# Patient Record
Sex: Female | Born: 1935 | Race: White | Hispanic: No | Smoking: Current every day smoker
Health system: Southern US, Community
[De-identification: ages and names within clinical notes are randomized; demographics above are authoritative.]

## PROBLEM LIST (undated history)

## (undated) DIAGNOSIS — J449 Chronic obstructive pulmonary disease, unspecified: Secondary | ICD-10-CM

---

## 2007-11-20 ENCOUNTER — Ambulatory Visit: Payer: Self-pay | Admitting: Internal Medicine

## 2008-12-27 ENCOUNTER — Ambulatory Visit: Payer: Self-pay | Admitting: Internal Medicine

## 2013-05-02 ENCOUNTER — Ambulatory Visit: Payer: Self-pay | Admitting: Orthopedic Surgery

## 2013-05-27 ENCOUNTER — Encounter: Payer: Self-pay | Admitting: Nurse Practitioner

## 2013-05-31 ENCOUNTER — Encounter: Payer: Self-pay | Admitting: Nurse Practitioner

## 2014-11-18 DIAGNOSIS — M5416 Radiculopathy, lumbar region: Secondary | ICD-10-CM | POA: Insufficient documentation

## 2014-11-18 DIAGNOSIS — M5126 Other intervertebral disc displacement, lumbar region: Secondary | ICD-10-CM | POA: Insufficient documentation

## 2015-01-12 ENCOUNTER — Encounter: Payer: Self-pay | Admitting: Family Medicine

## 2015-01-31 ENCOUNTER — Encounter: Payer: Self-pay | Admitting: Family Medicine

## 2015-05-18 ENCOUNTER — Other Ambulatory Visit: Payer: Self-pay | Admitting: Family Medicine

## 2015-05-18 ENCOUNTER — Ambulatory Visit
Admission: RE | Admit: 2015-05-18 | Discharge: 2015-05-18 | Disposition: A | Discharge status: Home / Self Care | Payer: Medicare Other | Source: Ambulatory Visit | Attending: Family Medicine | Admitting: Family Medicine

## 2015-05-18 DIAGNOSIS — J441 Chronic obstructive pulmonary disease with (acute) exacerbation: Secondary | ICD-10-CM | POA: Diagnosis present

## 2015-05-18 DIAGNOSIS — J439 Emphysema, unspecified: Secondary | ICD-10-CM | POA: Insufficient documentation

## 2015-05-18 DIAGNOSIS — J449 Chronic obstructive pulmonary disease, unspecified: Secondary | ICD-10-CM | POA: Insufficient documentation

## 2015-05-18 DIAGNOSIS — I7090 Unspecified atherosclerosis: Secondary | ICD-10-CM | POA: Insufficient documentation

## 2016-10-23 ENCOUNTER — Emergency Department: Payer: Medicare Other

## 2016-10-23 ENCOUNTER — Encounter: Payer: Self-pay | Admitting: Emergency Medicine

## 2016-10-23 ENCOUNTER — Emergency Department
Admission: EM | Admit: 2016-10-23 | Discharge: 2016-10-23 | Disposition: A | Discharge status: Home / Self Care | Payer: Medicare Other | Attending: Emergency Medicine | Admitting: Emergency Medicine

## 2016-10-23 DIAGNOSIS — F17210 Nicotine dependence, cigarettes, uncomplicated: Secondary | ICD-10-CM | POA: Insufficient documentation

## 2016-10-23 DIAGNOSIS — J441 Chronic obstructive pulmonary disease with (acute) exacerbation: Secondary | ICD-10-CM | POA: Insufficient documentation

## 2016-10-23 DIAGNOSIS — R0789 Other chest pain: Secondary | ICD-10-CM

## 2016-10-23 HISTORY — DX: Chronic obstructive pulmonary disease, unspecified: J44.9

## 2016-10-23 LAB — URINALYSIS COMPLETE WITH MICROSCOPIC (ARMC ONLY)
BACTERIA UA: NONE SEEN
Bilirubin Urine: NEGATIVE
Glucose, UA: NEGATIVE mg/dL
Ketones, ur: NEGATIVE mg/dL
Leukocytes, UA: NEGATIVE
NITRITE: NEGATIVE
PH: 7 (ref 5.0–8.0)
PROTEIN: NEGATIVE mg/dL
SPECIFIC GRAVITY, URINE: 1.011 (ref 1.005–1.030)

## 2016-10-23 LAB — BASIC METABOLIC PANEL
Anion gap: 10 (ref 5–15)
BUN: 9 mg/dL (ref 6–20)
CHLORIDE: 100 mmol/L — AB (ref 101–111)
CO2: 25 mmol/L (ref 22–32)
Calcium: 9.2 mg/dL (ref 8.9–10.3)
Creatinine, Ser: 0.82 mg/dL (ref 0.44–1.00)
GFR calc Af Amer: >60 mL/min (ref >60)
GFR calc non Af Amer: >60 mL/min (ref >60)
GLUCOSE: 100 mg/dL — AB (ref 65–99)
POTASSIUM: 4.2 mmol/L (ref 3.5–5.1)
SODIUM: 135 mmol/L (ref 135–145)

## 2016-10-23 LAB — CBC
HEMATOCRIT: 47.7 % — AB (ref 35.0–47.0)
Hemoglobin: 16.6 g/dL — ABNORMAL HIGH (ref 12.0–16.0)
MCH: 31 pg (ref 26.0–34.0)
MCHC: 34.8 g/dL (ref 32.0–36.0)
MCV: 89.2 fL (ref 80.0–100.0)
Platelets: 196 10*3/uL (ref 150–440)
RBC: 5.35 MIL/uL — ABNORMAL HIGH (ref 3.80–5.20)
RDW: 14 % (ref 11.5–14.5)
WBC: 8.4 10*3/uL (ref 3.6–11.0)

## 2016-10-23 LAB — TROPONIN I: Troponin I: <0.03 ng/mL (ref <0.03)

## 2016-10-23 MED ORDER — PREDNISONE 10 MG PO TABS: ORAL_TABLET | ORAL | 0 refills | Status: DC

## 2016-10-23 MED ORDER — PREDNISONE 20 MG PO TABS
40.0000 mg | ORAL_TABLET | Freq: Once | ORAL | Status: AC
  Administered 2016-10-23: 40 mg via ORAL
  Filled 2016-10-23: qty 2

## 2016-10-23 NOTE — Discharge Instructions (Signed)
You were evaluated for left-sided chest discomfort, and although no certain cause was found, we discussed your exam and evaluation are reassuring in the emergency department today.  We discussed the possibility of inflammation based pain such as pleurisy or muscle spasm, ibuprofen over-the-counter 600 mg every 8 hours may help with this.  We discussed that COPD exacerbation may be contributing, and go ahead and continue the clarithromycin and I will add prednisone.  We discussed that your heart evaluation is reassuring in the emergency department, but I want you to see a cardiologist this week. You may call the on-call cardiologist for today, Dr. Windell HummingbirdGollan's office, or a cardiologist of your choice.  Return to the emergency department immediately for any worsening symptoms including trouble breathing, fever, vomiting, palpitations, dizziness or numbness, confusion altered mental status, passing out, or any worsening chest pain.

## 2016-10-23 NOTE — ED Triage Notes (Signed)
Patient presents to the ED with left sided chest pain x 2-3 days that is worse with movement and sore on palpation.  Patient states, "I'm sure it's my copd acting up."  Patient reports pain has been getting increasingly worse x 2 days.  Patient reports recently taking antibiotics prescribed by pcp for copd exacerbation.  Patient is in no obvious distress at this time.  Patient states pain is relieved by lying still.  Patient denies shortness of breath at this time.  Patient's respirations are even and not labored at this time.

## 2016-10-23 NOTE — ED Provider Notes (Signed)
[facility] Emergency Department Provider Note ____________________________________________   I have reviewed the triage vital signs and the triage nursing note.  HISTORY  Chief Complaint Chest Pain   Historian Patient  HPI Makayla Maddox is a 82 y.o. female with a history of COPD, presents today due to 3 days of left under the breast pain which is occasionally sharp and she also feels it in the left back as well. She states that she thought may be COPD was causing and so she started increasing the dose that she's been using her Ventolin inhaler to about 4 times per day. She is unsure if the pain just eases off on its own or if it's related to the increase Ventolin use. No fever. She has sputum production which is yellowish. She was started on clarithromycin a few days ago and then stopped and then restarted it yesterday. She has not been on prednisone.  Because of the chest discomfort which is intermittent and nonexertional, and somewhat is not really reproducible, she did see her primary doctor Dr. Quillian QuinceBliss this morning. She was told to come to the ED for further evaluation due to chest pain and she was told possible changes on EKG.  She has never had a cardiac evaluation, nor history of a heart attack.    Past Medical History:  Diagnosis Date  . COPD (chronic obstructive pulmonary disease) (HCC)     There are no active problems to display for this patient.   History reviewed. No pertinent surgical history.  Prior to Admission medications   Medication Sig Start Date End Date Taking? Authorizing Provider  predniSONE (DELTASONE) 10 MG tablet 40mg  daily for 4 more days 10/23/16   Governor Rooksebecca Jasara Corrigan, MD    Allergies  Allergen Reactions  . Penicillins Hives    No family history on file.  Social History Social History  Substance Use Topics  . Smoking status: Current Every Day Smoker    Packs/day: 0.50    Types: Cigarettes  . Smokeless tobacco: Never Used   . Alcohol use Yes     Comment: 1 glass of wine with dinner daily    Review of Systems  Constitutional: Negative for fever. Eyes: Negative for visual changes. ENT: Negative for sore throat. Cardiovascular: Negative for Palpitations. Respiratory: Mild shortness of breath which is to her basically consistent with her underlying baseline COPD. Gastrointestinal: Negative for abdominal pain, vomiting and diarrhea. Genitourinary: Negative for dysuria. Musculoskeletal: Negative for back pain. Skin: Negative for rash. Neurological: Negative for headache. 10 point Review of Systems otherwise negative ____________________________________________   PHYSICAL EXAM:  VITAL SIGNS: ED Triage Vitals  Enc Vitals Group     BP 10/23/16 0920 (!) 168/103     Pulse Rate 10/23/16 0920 81     Resp 10/23/16 0920 (!) 22     Temp 10/23/16 0920 97.7 F (36.5 C)     Temp Source 10/23/16 0920 Oral     SpO2 10/23/16 0920 97 %     Weight 10/23/16 0921 112 lb (50.8 kg)     Height 10/23/16 0921 5\' 2"  (1.575 m)     Head Circumference --      Peak Flow --      Pain Score 10/23/16 0924 5     Pain Loc --      Pain Edu? --      Excl. in GC? --      Constitutional: Alert and oriented. Well appearing and in no distress. HEENT   Head: Normocephalic  and atraumatic.      Eyes: Conjunctivae are normal. PERRL. Normal extraocular movements.      Ears:         Nose: No congestion/rhinnorhea.   Mouth/Throat: Mucous membranes are moist.   Neck: No stridor. Cardiovascular/Chest: Normal rate, regular rhythm.  No murmurs, rubs, or gallops. Respiratory: Normal respiratory effort without tachypnea nor retractions. Breath sounds are clear and equal bilaterally. No wheezes/rales/rhonchi. Gastrointestinal: Soft. No distention, no guarding, no rebound. Nontender.    Genitourinary/rectal:Deferred Musculoskeletal: Nontender with normal range of motion in all extremities. No joint effusions.  No lower extremity  tenderness.  No edema. Neurologic:  Normal speech and language. No gross or focal neurologic deficits are appreciated. Skin:  Skin is warm, dry and intact. No rash noted. Psychiatric: Mood and affect are normal. Speech and behavior are normal. Patient exhibits appropriate insight and judgment.   ____________________________________________  LABS (pertinent positives/negatives)  Labs Reviewed  BASIC METABOLIC PANEL - Abnormal; Notable for the following:       Result Value   Chloride 100 (*)    Glucose, Bld 100 (*)    All other components within normal limits  CBC - Abnormal; Notable for the following:    RBC 5.35 (*)    Hemoglobin 16.6 (*)    HCT 47.7 (*)    All other components within normal limits  URINALYSIS COMPLETEWITH MICROSCOPIC ([facility] ONLY) - Abnormal; Notable for the following:    Color, Urine YELLOW (*)    APPearance CLEAR (*)    Hgb urine dipstick 1+ (*)    Squamous Epithelial / LPF 0-5 (*)    All other components within normal limits  TROPONIN I    ____________________________________________    EKG I, Governor Rooks, MD, the attending physician have personally viewed and interpreted all ECGs.  77 bpm. Normal sinus rhythm. Incomplete right bundle branch block. Normal axis. Normal ST and T-wave ____________________________________________  RADIOLOGY All Xrays were viewed by me. Imaging interpreted by Radiologist.  Chest x-ray two-view: No active cardiopulmonary disease. Aortic atherosclerosis. __________________________________________  PROCEDURES  Procedure(s) performed: None  Critical Care performed: None  ____________________________________________   ED COURSE / ASSESSMENT AND PLAN  Pertinent labs & imaging results that were available during my care of the patient were reviewed by me and considered in my medical decision making (see chart for details).    [name] was sent here by her primary doctor due to complaint of chest pain. On my  exam she is describing nonexertional intermittent, atypical chest discomfort. She is having no pleuritic chest pain. She did describe some burping and belching after she ate some sausage. She also thinks that may be the Ventolin is helping.  The EKG sent from the office was read by the computer as possible inferior infarct, and when I look at this EKG that was sent with her there is some motion artifact inferiorly, there is at least one beat that shows normal ST segment. On the EKG here is reassuring. Symptoms have been fairly frequent for a few days now. Very atypical, and I think it is unlikely to be due to ACS. She has not had a cardiac evaluation, and I have recommended that she follow up with a cardiologist. She states that she wanted to see her own cardiologist but I will give her the follow-up office number for our on-call cardiologist.  Troponin negative and reassuring. Symptoms ongoing all night, I don't think that she needs a repeat today.  Chest x-ray shows no pneumonia or  collapsed lung. Symptoms do not sound consistent with PE.  No hypoxia, no fever, and no tachycardia.  I discussed the possibility of pleurisy and muscle spasm versus GI vs symptoms associated with copd exacerbation.  I'm going to add prednisone to help with COPD exacerbation, and potentially other inflammatory based painful conditions.   CONSULTATIONS:   None   Patient / Family / Caregiver informed of clinical course, medical decision-making process, and agree with plan.   I discussed return precautions, follow-up instructions, and discharge instructions with patient and/or family.   ___________________________________________   FINAL CLINICAL IMPRESSION(S) / ED DIAGNOSES   Final diagnoses:  Atypical chest pain  COPD exacerbation (HCC)              Note: This dictation was prepared with Dragon dictation. Any transcriptional errors that result from this process are unintentional    Governor Rooks, MD 10/23/16 1200

## 2017-12-14 IMAGING — CR DG CHEST 2V
2 series · 2 of 2 positions shown · non-contrast
Comparison: Radiographs May 18, 2015.

CLINICAL DATA: Chest pain.

EXAM:
CHEST  2 VIEW

[chest pa]
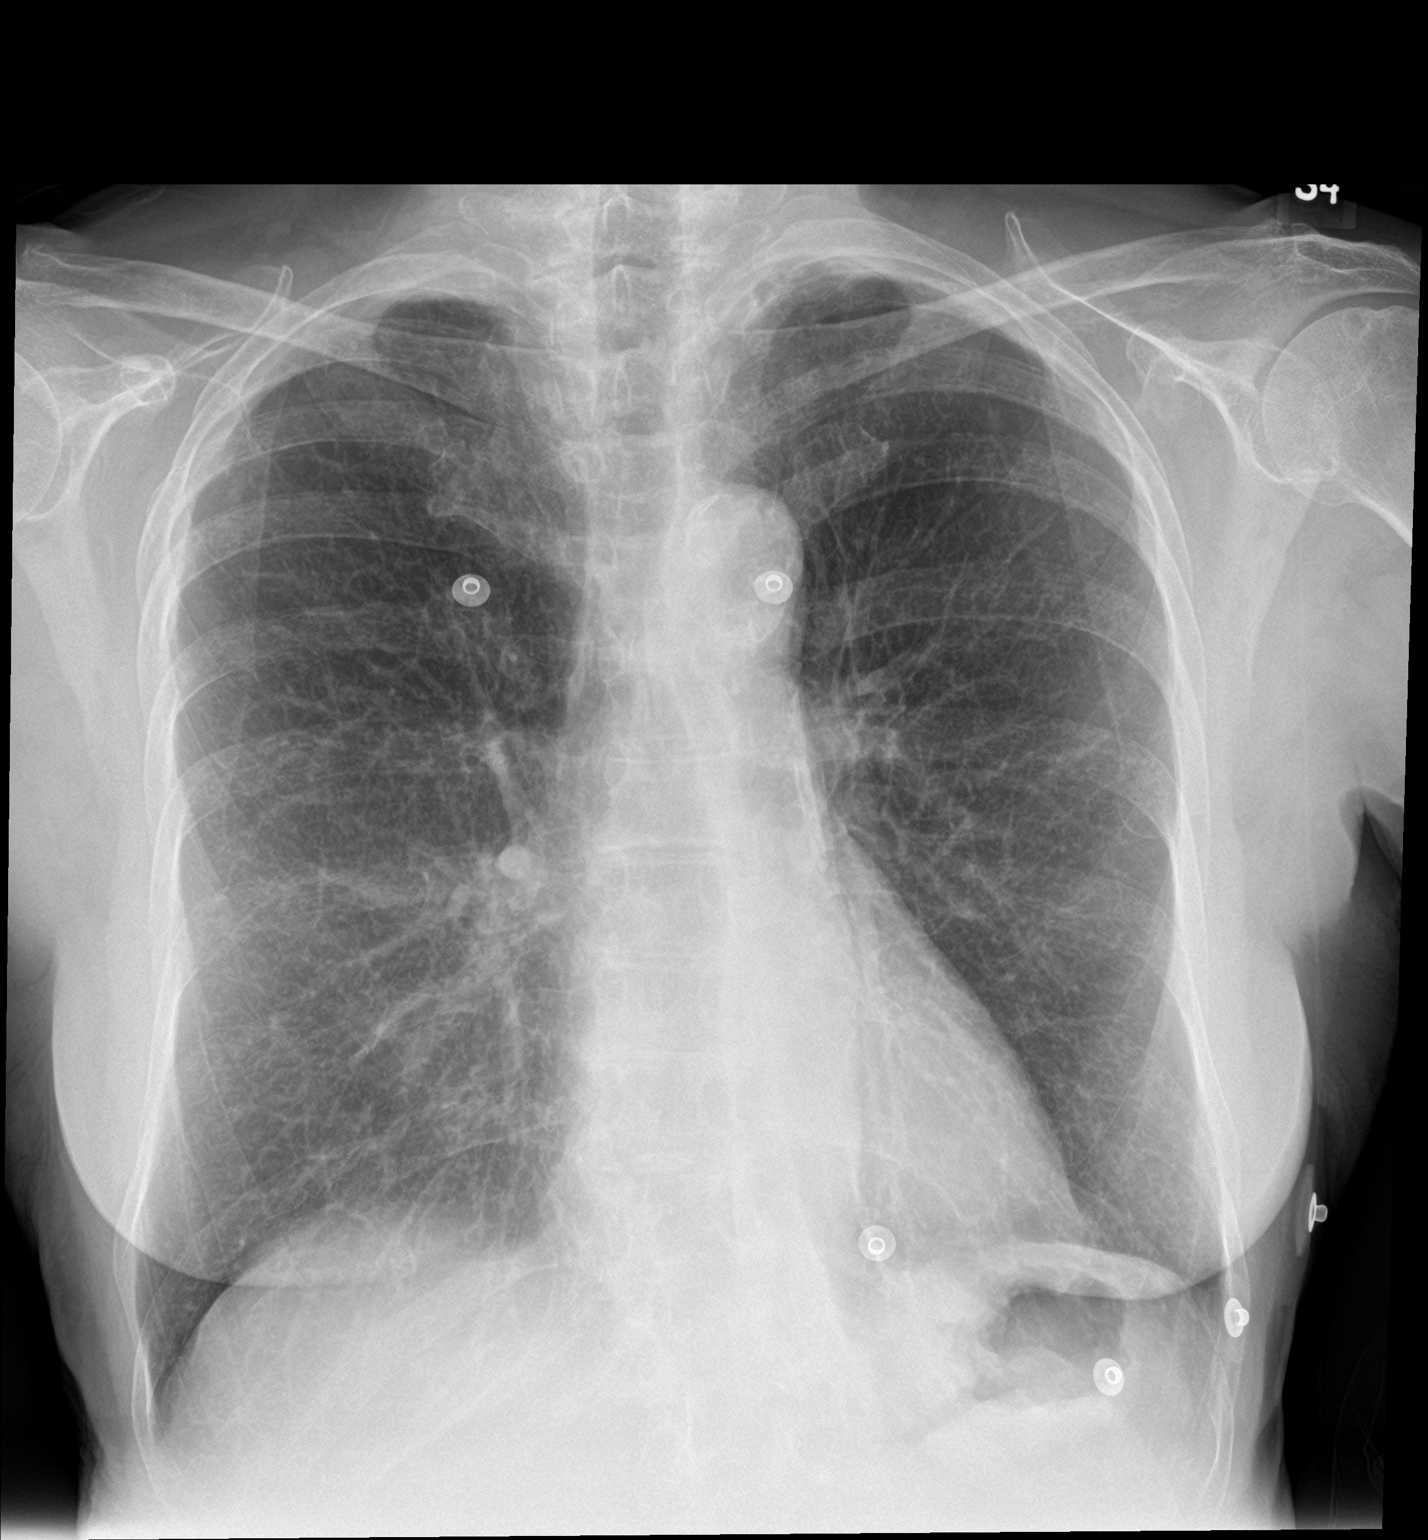

[chest lat]
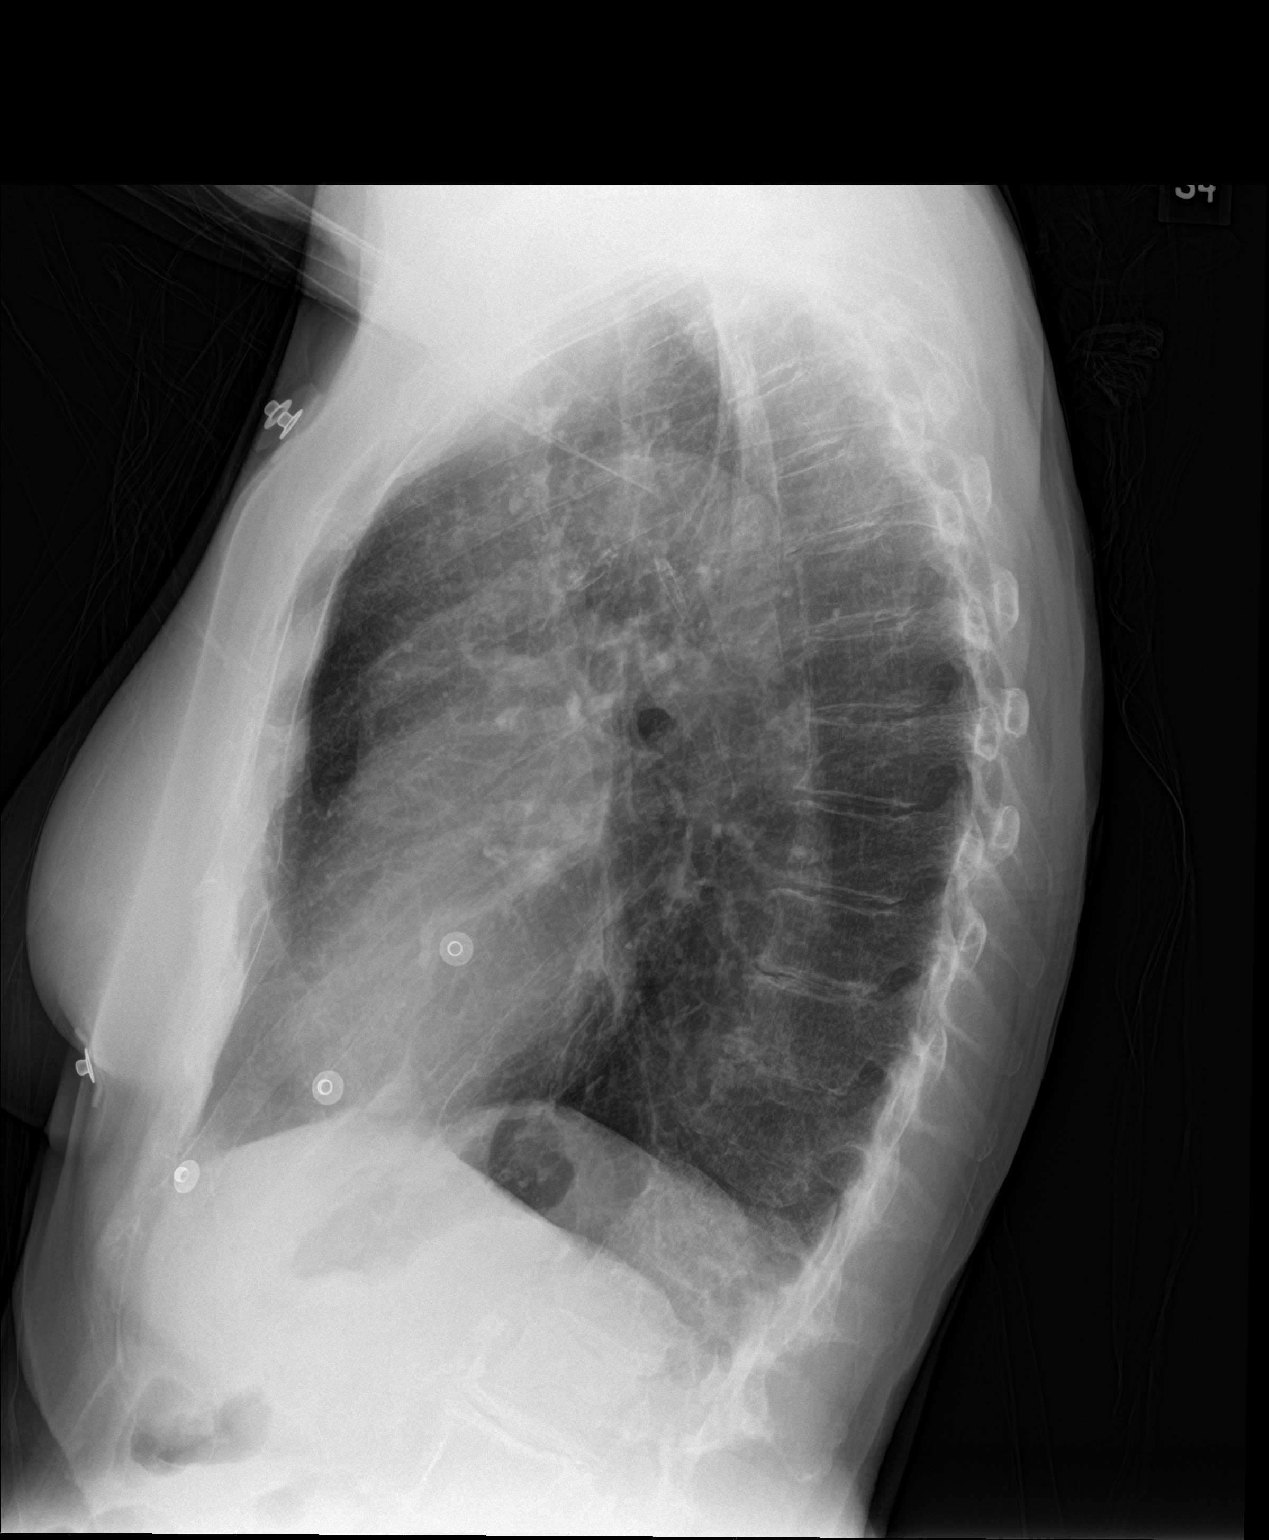

[2 of 2 positions shown; findings below may reference images not displayed]

FINDINGS: The heart size and mediastinal contours are within normal limits.
Both lungs are clear. No pneumothorax or pleural effusion is noted.
Atherosclerosis of thoracic aorta is noted. The visualized skeletal
structures are unremarkable.
IMPRESSION: No active cardiopulmonary disease.  Aortic atherosclerosis.

## 2020-12-31 DIAGNOSIS — F17210 Nicotine dependence, cigarettes, uncomplicated: Secondary | ICD-10-CM | POA: Insufficient documentation

## 2020-12-31 DIAGNOSIS — J449 Chronic obstructive pulmonary disease, unspecified: Secondary | ICD-10-CM | POA: Insufficient documentation

## 2021-05-31 DIAGNOSIS — M5442 Lumbago with sciatica, left side: Secondary | ICD-10-CM | POA: Insufficient documentation

## 2021-05-31 DIAGNOSIS — M160 Bilateral primary osteoarthritis of hip: Secondary | ICD-10-CM | POA: Insufficient documentation

## 2021-06-21 DIAGNOSIS — R030 Elevated blood-pressure reading, without diagnosis of hypertension: Secondary | ICD-10-CM | POA: Insufficient documentation

## 2023-03-01 DEATH — deceased

## 2023-04-26 ENCOUNTER — Other Ambulatory Visit (INDEPENDENT_AMBULATORY_CARE_PROVIDER_SITE_OTHER): Payer: Self-pay | Admitting: Nurse Practitioner

## 2023-04-26 DIAGNOSIS — I739 Peripheral vascular disease, unspecified: Secondary | ICD-10-CM

## 2023-05-01 ENCOUNTER — Encounter (INDEPENDENT_AMBULATORY_CARE_PROVIDER_SITE_OTHER): Payer: Self-pay | Admitting: Nurse Practitioner

## 2023-05-01 ENCOUNTER — Encounter (INDEPENDENT_AMBULATORY_CARE_PROVIDER_SITE_OTHER): Payer: Self-pay

## 2023-05-24 ENCOUNTER — Encounter (INDEPENDENT_AMBULATORY_CARE_PROVIDER_SITE_OTHER): Payer: Self-pay | Admitting: Nurse Practitioner

## 2023-05-24 ENCOUNTER — Ambulatory Visit (INDEPENDENT_AMBULATORY_CARE_PROVIDER_SITE_OTHER): Payer: Medicare Other

## 2023-05-24 ENCOUNTER — Ambulatory Visit (INDEPENDENT_AMBULATORY_CARE_PROVIDER_SITE_OTHER): Payer: Medicare Other | Admitting: Nurse Practitioner

## 2023-05-24 VITALS — BP 171/70 | HR 98 | Resp 17 | Ht 62.0 in | Wt 86.2 lb

## 2023-05-24 DIAGNOSIS — I10 Essential (primary) hypertension: Secondary | ICD-10-CM

## 2023-05-24 DIAGNOSIS — I739 Peripheral vascular disease, unspecified: Secondary | ICD-10-CM

## 2023-05-24 DIAGNOSIS — F17200 Nicotine dependence, unspecified, uncomplicated: Secondary | ICD-10-CM

## 2023-05-27 ENCOUNTER — Encounter (INDEPENDENT_AMBULATORY_CARE_PROVIDER_SITE_OTHER): Payer: Self-pay | Admitting: Nurse Practitioner

## 2023-05-27 NOTE — Progress Notes (Signed)
Subjective:    Patient ID: Telford Nab, female    DOB: November 22, 1931, 89 y.o.   MRN: [id number] Chief Complaint  Patient presents with   Establish Care     The patient is seen for evaluation of painful lower extremities and diminished pulses. Patient notes the pain is always associated with activity and is very consistent day today. Typically, the pain occurs at less than one block, progress is as activity continues to the point that the patient must stop walking. Resting including standing still for several minutes allows the patient to walk a similar distance before being forced to stop again. Uneven terrain and inclines shorten the distance. The pain has been progressive over the past several years. The patient denies any abrupt changes in claudication symptoms.  Patient has some cramping at night but it is sporadic.  No open wounds or sores at this time. No prior interventions or surgeries.  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   The patient's blood pressure has been stable and relatively well controlled. The patient denies amaurosis fugax or recent TIA symptoms. There are no recent neurological changes noted. The patient denies history of DVT, PE or superficial thrombophlebitis. The patient denies recent episodes of angina or shortness of breath.   Today noninvasive studies show an ABI of 0.5 on the right and 0.78 on the left.  Additional arterial duplex shows 50 to 74% stenosis of the left lower extremity SFA and popliteal artery.  The right has multiple areas of 30 to 49% stenosis with an occluded posterior tibial artery.  Waveforms are mixed with biphasic/monophasic waveforms    Review of Systems  Musculoskeletal:  Positive for arthralgias and back pain.  Skin:  Negative for wound.  All other systems reviewed and are negative.      Objective:   Physical Exam Vitals reviewed.  HENT:     Head: Normocephalic.  Cardiovascular:     Rate and Rhythm:  Normal rate.     Pulses:          Dorsalis pedis pulses are detected w/ Doppler on the right side and detected w/ Doppler on the left side.       Posterior tibial pulses are detected w/ Doppler on the right side and detected w/ Doppler on the left side.  Pulmonary:     Effort: Pulmonary effort is normal.  Skin:    General: Skin is warm and dry.  Neurological:     Mental Status: She is alert and oriented to person, place, and time.  Psychiatric:        Mood and Affect: Mood normal.        Behavior: Behavior normal.        Thought Content: Thought content normal.        Judgment: Judgment normal.     BP (!) 171/70 (BP Location: Left Arm)   Pulse 98   Resp 17   Ht 5\' 2"  (1.575 m)   Wt 86 lb 3.2 oz (39.1 kg)   BMI 15.77 kg/m   Past Medical History:  Diagnosis Date   COPD (chronic obstructive pulmonary disease) (HCC)     Social History   Socioeconomic History   Marital status: Widowed    Spouse name: Not on file   Number of children: Not on file   Years of education: Not on file   Highest education level: Not on file  Occupational History   Not on file  Tobacco Use  Smoking status: Every Day    Packs/day: .5    Types: Cigarettes   Smokeless tobacco: Never  Substance and Sexual Activity   Alcohol use: Yes    Comment: 1 glass of wine with dinner daily   Drug use: Not on file   Sexual activity: Not on file  Other Topics Concern   Not on file  Social History Narrative   Not on file   Social Determinants of Health   Financial Resource Strain: Not on file  Food Insecurity: Not on file  Transportation Needs: Not on file  Physical Activity: Not on file  Stress: Not on file  Social Connections: Not on file  Intimate Partner Violence: Not on file    History reviewed. No pertinent surgical history.  Family History  Problem Relation Age of Onset   Heart disease Mother     Allergies  Allergen Reactions   Penicillins Hives   Quinolones        Latest Ref  Rng & Units 10/23/2016    9:28 AM  CBC  WBC 3.6 - 11.0 K/uL 8.4   Hemoglobin 12.0 - 16.0 g/dL 19.1   Hematocrit 47.8 - 47.0 % 47.7   Platelets 150 - 440 K/uL 196       CMP     Component Value Date/Time   NA 135 10/23/2016 0928   K 4.2 10/23/2016 0928   CL 100 (L) 10/23/2016 0928   CO2 25 10/23/2016 0928   GLUCOSE 100 (H) 10/23/2016 0928   BUN 9 10/23/2016 0928   CREATININE 0.82 10/23/2016 0928   CALCIUM 9.2 10/23/2016 0928   GFRNONAA >60 10/23/2016 0928   GFRAA >60 10/23/2016 0928     No results found.     Assessment & Plan:   1. PAD (peripheral artery disease) (HCC) I had a long discussion with the patient and her son in the in regards to her study results.  The patient does have evidence of peripheral arterial disease however her symptoms are not yet limb threatening.  I suspect that the pain that she has in her legs may have a neuropathic component as the symptoms come and go and they are not consistent nightly.  We had a discussion about options including conservative therapy versus angiogram.  At this time the patient wishes to try some conservative therapy tactics including adding an aspirin.  We talked about Pletal but the patient is not a fan of medication.  Her son has been using some natural supplements which she has found helpful.  We discussed that this is certainly something they can try.  We discussed warning signs symptoms such as worsening claudication, consistent rest pain or discoloration formation wounds.  They have any of the symptoms they should follow-up sooner.  Otherwise we will have her return in 3 months for reevaluation.    2. Primary hypertension Continue antihypertensive medications as already ordered, these medications have been reviewed and there are no changes at this time.  3. Smoker Smoking cessation was discussed, 3-10 minutes spent on this topic specifically   Current Outpatient Medications on File Prior to Visit  Medication Sig Dispense  Refill   albuterol (VENTOLIN HFA) 108 (90 Base) MCG/ACT inhaler 2 puff EVERY 4 - 6 HOURS AS NEEDED (route: inhalation)     amLODipine (NORVASC) 2.5 MG tablet 0.5 tablet EVERY DAY (route: oral)     ibuprofen (ADVIL) 200 MG tablet 1 tablet DAILY (route: oral)     ipratropium-albuterol (DUONEB) 0.5-2.5 (3) MG/3ML SOLN SMARTSIG:3  Milliliter(s) Via Inhaler 3 Times Daily PRN     Multiple Vitamins-Minerals (MULTIVITAMIN ADULT, MINERALS, PO) 1 tablet DAILY (route: oral)     predniSONE (DELTASONE) 10 MG tablet 40mg  daily for 4 more days 16 tablet 0   SPIRIVA RESPIMAT 2.5 MCG/ACT AERS SMARTSIG:2 Puff(s) Via Inhaler Daily     WIXELA INHUB 250-50 MCG/ACT AEPB Inhale 1 puff into the lungs 2 (two) times daily.     No current facility-administered medications on file prior to visit.    There are no Patient Instructions on file for this visit. No follow-ups on file.   Georgiana Spinner, NP

## 2023-05-29 LAB — VAS US ABI WITH/WO TBI
Left ABI: 0.78
Right ABI: 0.85

## 2023-08-23 ENCOUNTER — Other Ambulatory Visit (INDEPENDENT_AMBULATORY_CARE_PROVIDER_SITE_OTHER): Payer: Self-pay | Admitting: Nurse Practitioner

## 2023-08-23 DIAGNOSIS — I739 Peripheral vascular disease, unspecified: Secondary | ICD-10-CM

## 2023-09-03 ENCOUNTER — Ambulatory Visit (INDEPENDENT_AMBULATORY_CARE_PROVIDER_SITE_OTHER): Payer: Self-pay | Admitting: Nurse Practitioner

## 2023-09-03 ENCOUNTER — Encounter (INDEPENDENT_AMBULATORY_CARE_PROVIDER_SITE_OTHER): Payer: Self-pay

## 2024-02-29 DEATH — deceased
